# Patient Record
Sex: Male | Born: 1970 | Race: Black or African American | Hispanic: No | Marital: Married | Smoking: Light tobacco smoker
Health system: Southern US, Community
[De-identification: ages and names within clinical notes are randomized; demographics above are authoritative.]

## PROBLEM LIST (undated history)

## (undated) DIAGNOSIS — I10 Essential (primary) hypertension: Secondary | ICD-10-CM

## (undated) DIAGNOSIS — N189 Chronic kidney disease, unspecified: Secondary | ICD-10-CM

## (undated) HISTORY — PX: APPENDECTOMY: SHX54

---

## 2012-09-10 ENCOUNTER — Emergency Department: Payer: Self-pay | Admitting: Emergency Medicine

## 2013-11-30 ENCOUNTER — Other Ambulatory Visit: Payer: Self-pay | Admitting: Nephrology

## 2013-11-30 DIAGNOSIS — N289 Disorder of kidney and ureter, unspecified: Secondary | ICD-10-CM

## 2013-12-03 ENCOUNTER — Ambulatory Visit
Admission: RE | Admit: 2013-12-03 | Discharge: 2013-12-03 | Disposition: A | Discharge status: Home / Self Care | Payer: 59 | Source: Ambulatory Visit | Attending: Nephrology | Admitting: Nephrology

## 2013-12-03 DIAGNOSIS — N289 Disorder of kidney and ureter, unspecified: Secondary | ICD-10-CM

## 2013-12-03 HISTORY — DX: Chronic kidney disease, unspecified: N18.9

## 2015-09-12 IMAGING — US US RENAL
1 series · 14 of 25 positions shown · non-contrast
Comparison: None.

CLINICAL DATA: Chronic kidney disease stage III

EXAM:
RENAL/URINARY TRACT ULTRASOUND COMPLETE

[Series 1: us renal · 0.25mm/px · 14 of 32 slices shown]
[im 1/32]
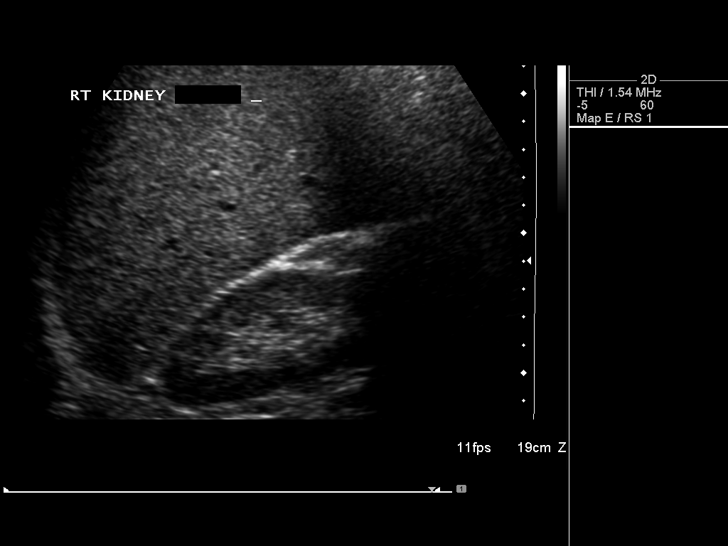
[im 3/32]
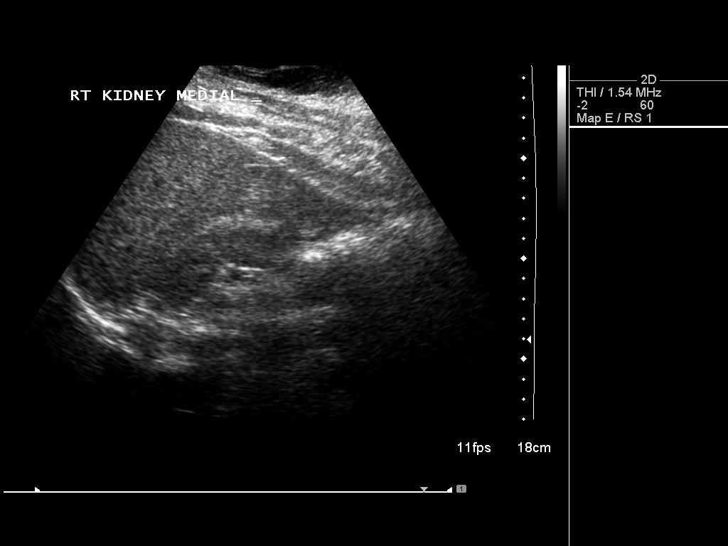
[im 6/32]
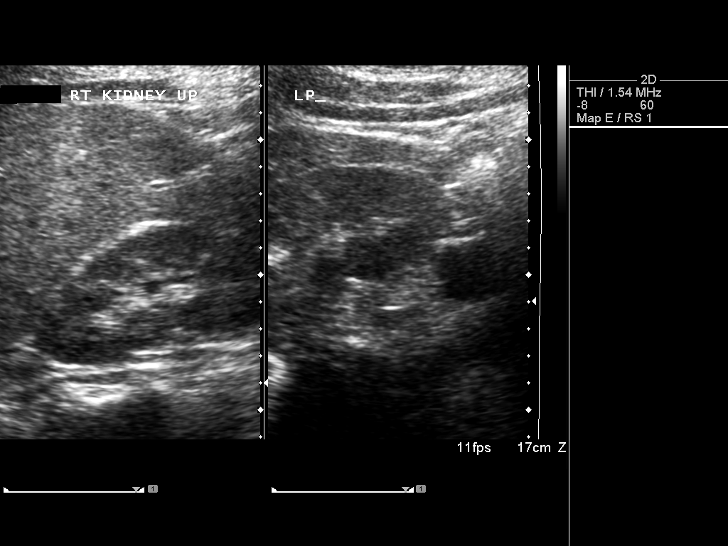
[im 8/32]
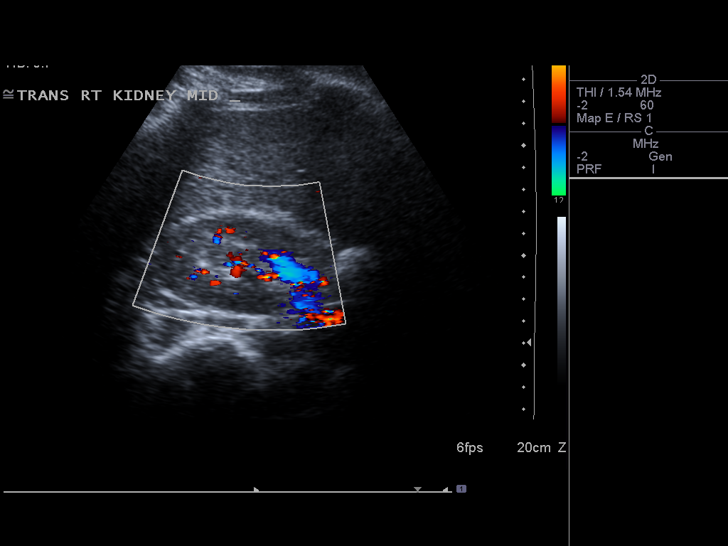
[im 11/32]
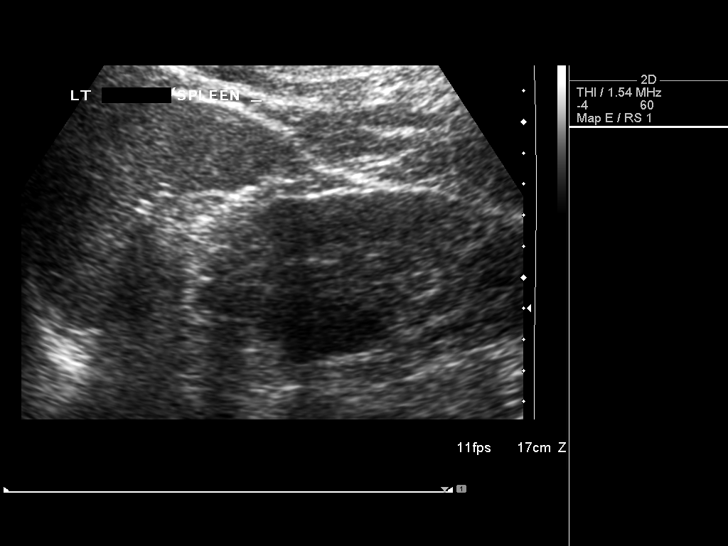
[im 12/32]
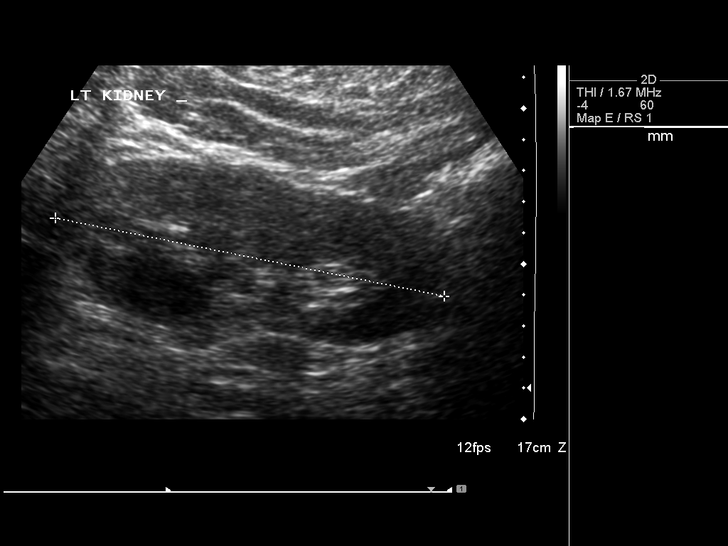
[im 15/32]
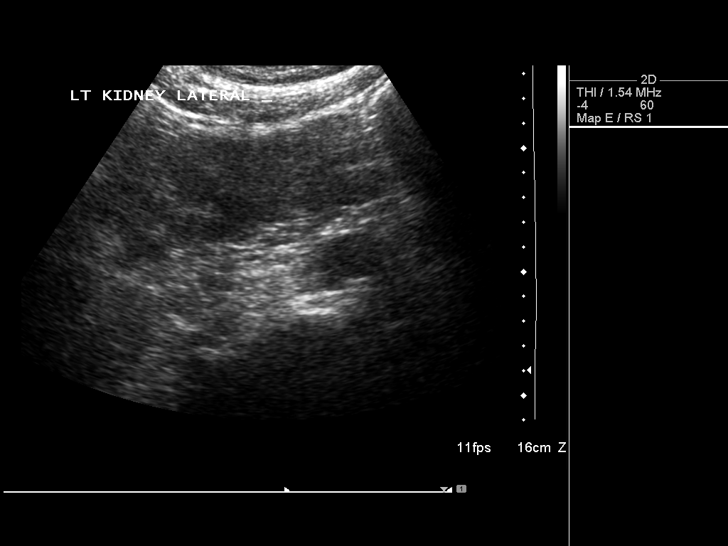
[im 17/32]
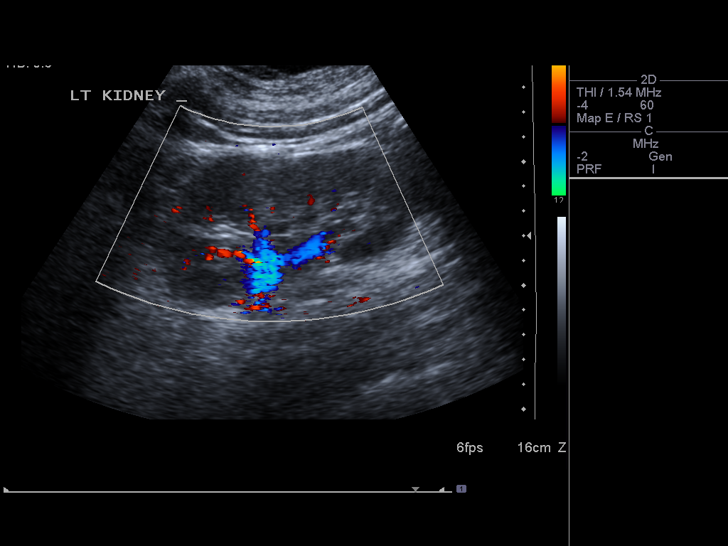
[im 20/32]
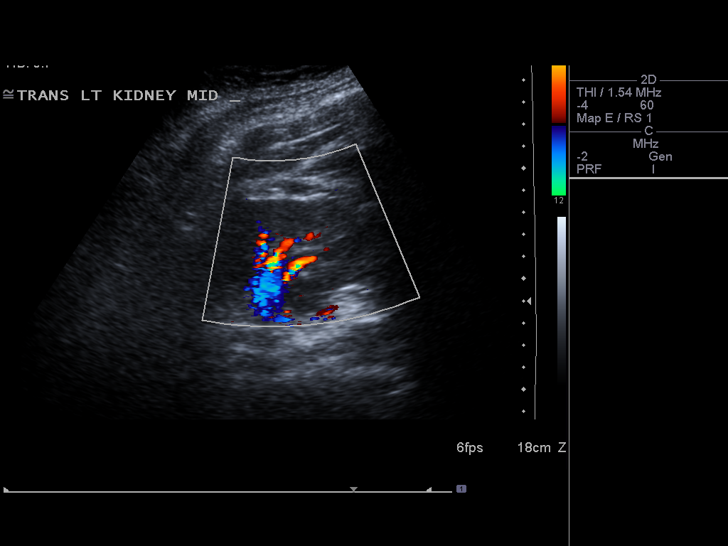
[im 21/32]
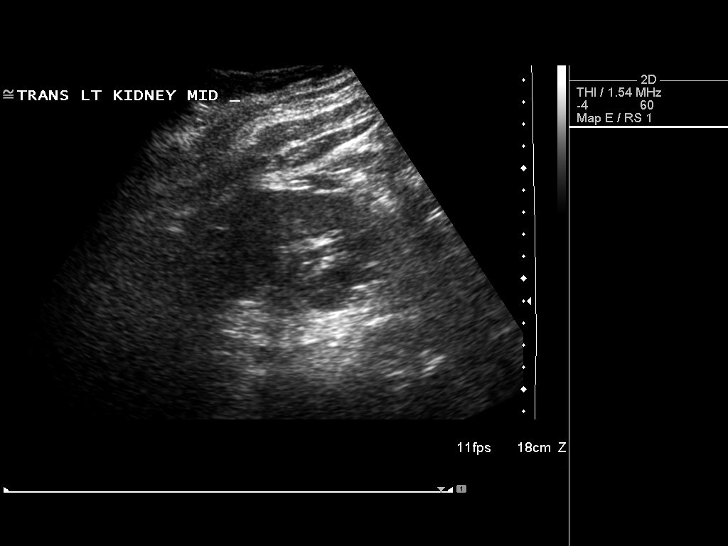
[im 24/32]
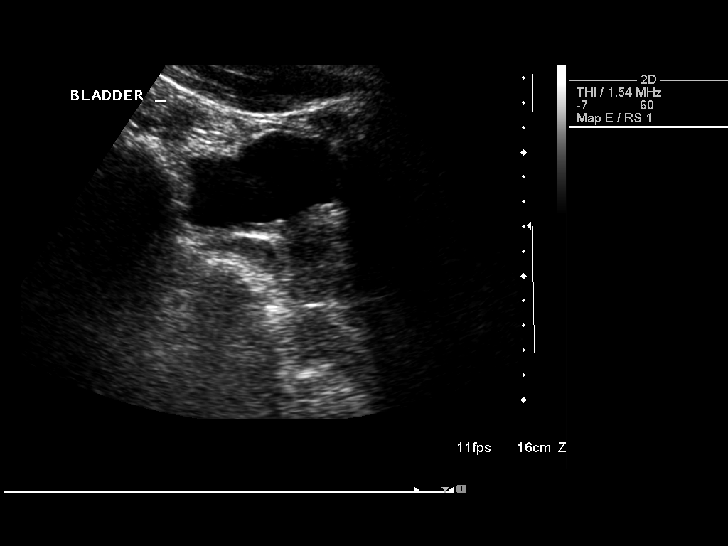
[im 26/32]
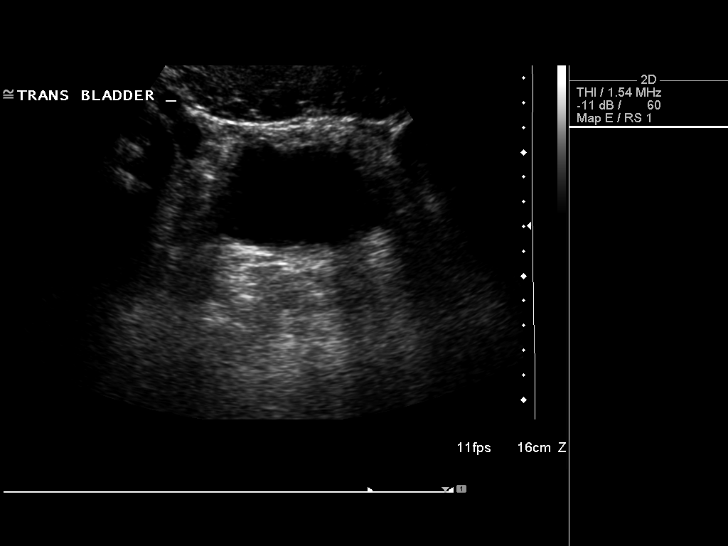
[im 29/32]
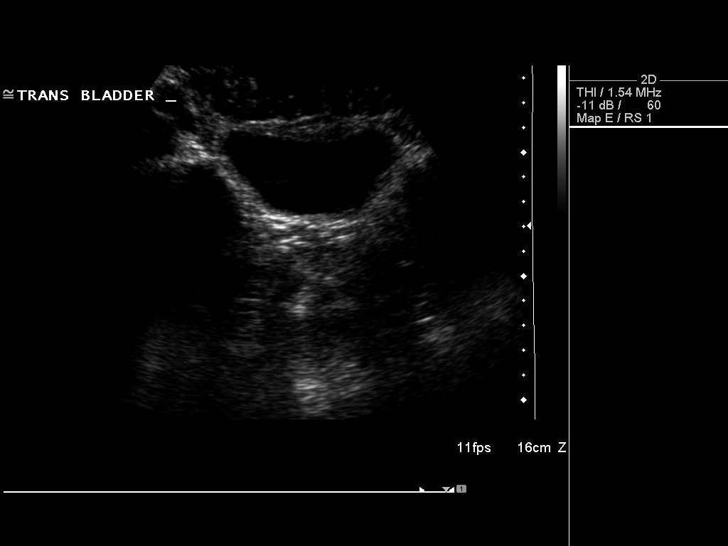
[im 32/32]
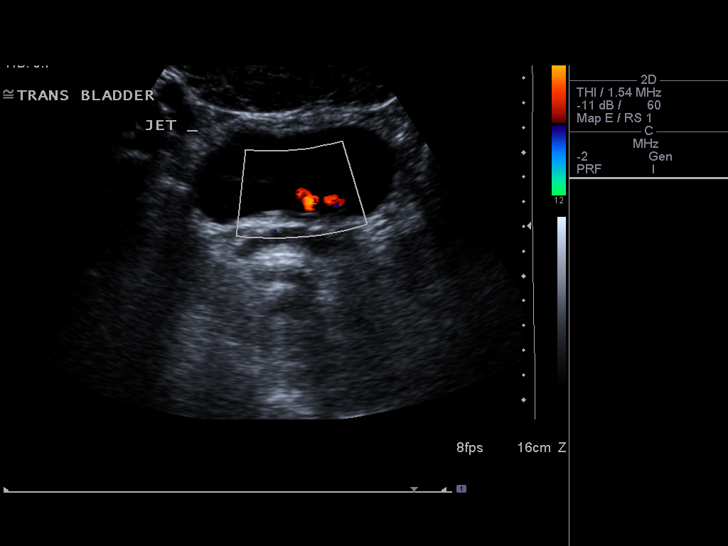

[14 of 25 positions shown; findings below may reference images not displayed]

FINDINGS: Right Kidney:

Length: 12.7 cm.  No mass or hydronephrosis.

Left Kidney:

Length: 12.8 cm.  No mass or hydronephrosis.

Bladder:

Underdistended but otherwise unremarkable.
IMPRESSION: Negative renal ultrasound.

## 2019-10-12 ENCOUNTER — Ambulatory Visit: Payer: Managed Care, Other (non HMO) | Attending: Internal Medicine

## 2019-10-12 DIAGNOSIS — Z23 Encounter for immunization: Secondary | ICD-10-CM | POA: Insufficient documentation

## 2019-10-15 NOTE — Progress Notes (Signed)
   Covid-19 Vaccination Clinic  Name:  Jermaine Garcia    MRN: [id number] DOB: Apr 10, 1971  10/12/2019  Jermaine Garcia was observed post Covid-19 immunization for 15 minutes without incidence. He was provided with Vaccine Information Sheet and instruction to access the V-Safe system.   Jermaine Garcia was instructed to call 911 with any severe reactions post vaccine: Marland Kitchen Difficulty breathing  . Swelling of your face and throat  . A fast heartbeat  . A bad rash all over your body  . Dizziness and weakness    Immunizations Administered    Name Date Dose VIS Date Route   Moderna COVID-19 Vaccine 10/12/2019 10:08 AM 0.5 mL 08/21/2019 Intramuscular   Manufacturer: Moderna   Lot: [id number]   NDC: 41962-229-79

## 2019-11-09 ENCOUNTER — Ambulatory Visit: Payer: Managed Care, Other (non HMO)

## 2019-11-16 ENCOUNTER — Ambulatory Visit: Payer: Managed Care, Other (non HMO) | Attending: Internal Medicine

## 2019-11-16 DIAGNOSIS — Z23 Encounter for immunization: Secondary | ICD-10-CM | POA: Insufficient documentation

## 2019-11-16 NOTE — Progress Notes (Signed)
   Covid-19 Vaccination Clinic  Name:  Jermaine Garcia    MRN: [id number] DOB: Feb 03, 1971  11/16/2019  [name] was observed post Covid-19 immunization for 15 minutes without incidence. He was provided with Vaccine Information Sheet and instruction to access the V-Safe system.   [name] was instructed to call 911 with any severe reactions post vaccine: Marland Kitchen Difficulty breathing  . Swelling of your face and throat  . A fast heartbeat  . A bad rash all over your body  . Dizziness and weakness    Immunizations Administered    Name Date Dose VIS Date Route   Moderna COVID-19 Vaccine 11/16/2019 10:08 AM 0.5 mL 08/21/2019 Intramuscular   Manufacturer: Moderna   Lot: [id number]   NDC: 48270-786-75

## 2020-09-24 ENCOUNTER — Other Ambulatory Visit: Payer: Managed Care, Other (non HMO)

## 2020-09-24 DIAGNOSIS — Z20822 Contact with and (suspected) exposure to covid-19: Secondary | ICD-10-CM

## 2020-09-24 NOTE — Progress Notes (Signed)
nov 

## 2020-09-25 LAB — NOVEL CORONAVIRUS, NAA: SARS-CoV-2, NAA: NOT DETECTED

## 2020-09-25 LAB — SARS-COV-2, NAA 2 DAY TAT

## 2022-03-17 ENCOUNTER — Telehealth: Payer: Self-pay

## 2022-03-17 DIAGNOSIS — Z1211 Encounter for screening for malignant neoplasm of colon: Secondary | ICD-10-CM

## 2022-03-17 MED ORDER — NA SULFATE-K SULFATE-MG SULF 17.5-3.13-1.6 GM/177ML PO SOLN: 1.0000 | Freq: Once | ORAL | 0 refills | Status: AC

## 2022-03-17 NOTE — Telephone Encounter (Signed)
Gastroenterology Pre-Procedure Review  Request Date: 04/01/22 Requesting Physician: Dr. Tobi Bastos  PATIENT REVIEW QUESTIONS: The patient responded to the following health history questions as indicated:    1. Are you having any GI issues? yes (bloating changed diet) 2. Do you have a personal history of Polyps? no 3. Do you have a family history of Colon Cancer or Polyps? no 4. Diabetes Mellitus? no 5. Joint replacements in the past 12 months?no 6. Major health problems in the past 3 months?no 7. Any artificial heart valves, MVP, or defibrillator?no    MEDICATIONS & ALLERGIES:    Patient reports the following regarding taking any anticoagulation/antiplatelet therapy:   Plavix, Coumadin, Eliquis, Xarelto, Lovenox, Pradaxa, Brilinta, or Effient? no Aspirin? no  Patient confirms/reports the following medications:  No current outpatient medications on file.   No current facility-administered medications for this visit.    Patient confirms/reports the following allergies:  Not on File  No orders of the defined types were placed in this encounter.   AUTHORIZATION INFORMATION Primary Insurance: 1D#: Group #:  Secondary Insurance: 1D#: Group #:  SCHEDULE INFORMATION: Date: 04/01/22 Time: Location: [facility]

## 2022-04-01 ENCOUNTER — Other Ambulatory Visit: Payer: Self-pay

## 2022-04-01 DIAGNOSIS — Z1211 Encounter for screening for malignant neoplasm of colon: Secondary | ICD-10-CM

## 2022-06-17 ENCOUNTER — Encounter: Payer: Self-pay | Admitting: Gastroenterology

## 2022-07-20 ENCOUNTER — Encounter
Admission: RE | Disposition: A | Discharge status: Home / Self Care | Payer: Self-pay | Source: Home / Self Care | Attending: Gastroenterology

## 2022-07-20 ENCOUNTER — Ambulatory Visit
Admission: RE | Admit: 2022-07-20 | Discharge: 2022-07-20 | Disposition: A | Discharge status: Home / Self Care | Payer: No Typology Code available for payment source | Attending: Gastroenterology | Admitting: Gastroenterology

## 2022-07-20 ENCOUNTER — Ambulatory Visit: Payer: No Typology Code available for payment source

## 2022-07-20 ENCOUNTER — Encounter: Payer: Self-pay | Admitting: Gastroenterology

## 2022-07-20 DIAGNOSIS — Z1211 Encounter for screening for malignant neoplasm of colon: Secondary | ICD-10-CM | POA: Diagnosis present

## 2022-07-20 DIAGNOSIS — G4730 Sleep apnea, unspecified: Secondary | ICD-10-CM | POA: Diagnosis not present

## 2022-07-20 DIAGNOSIS — F17290 Nicotine dependence, other tobacco product, uncomplicated: Secondary | ICD-10-CM | POA: Diagnosis not present

## 2022-07-20 DIAGNOSIS — N189 Chronic kidney disease, unspecified: Secondary | ICD-10-CM | POA: Diagnosis not present

## 2022-07-20 DIAGNOSIS — Z9049 Acquired absence of other specified parts of digestive tract: Secondary | ICD-10-CM | POA: Insufficient documentation

## 2022-07-20 HISTORY — PX: COLONOSCOPY WITH PROPOFOL: SHX5780

## 2022-07-20 SURGERY — COLONOSCOPY WITH PROPOFOL
Anesthesia: General

## 2022-07-20 MED ORDER — DEXMEDETOMIDINE HCL IN NACL 80 MCG/20ML IV SOLN
INTRAVENOUS | Status: AC
  Filled 2022-07-20: qty 20

## 2022-07-20 MED ORDER — PROPOFOL 1000 MG/100ML IV EMUL
INTRAVENOUS | Status: AC
  Filled 2022-07-20: qty 100

## 2022-07-20 MED ORDER — PROPOFOL 500 MG/50ML IV EMUL
INTRAVENOUS | Status: DC | PRN
  Administered 2022-07-20: 140 ug/kg/min via INTRAVENOUS

## 2022-07-20 MED ORDER — SODIUM CHLORIDE 0.9 % IV SOLN: INTRAVENOUS | Status: DC

## 2022-07-20 MED ORDER — PROPOFOL 10 MG/ML IV BOLUS
INTRAVENOUS | Status: AC
  Filled 2022-07-20: qty 20

## 2022-07-20 MED ORDER — PROPOFOL 10 MG/ML IV BOLUS
INTRAVENOUS | Status: DC | PRN
  Administered 2022-07-20: 80 mg via INTRAVENOUS

## 2022-07-20 MED ORDER — LIDOCAINE HCL (PF) 2 % IJ SOLN
INTRAMUSCULAR | Status: AC
  Filled 2022-07-20: qty 5

## 2022-07-20 NOTE — Anesthesia Preprocedure Evaluation (Signed)
Anesthesia Evaluation  Patient identified by MRN, date of birth, ID band Patient awake    Reviewed: Allergy & Precautions, NPO status , Patient's Chart, lab work & pertinent test results  Airway Mallampati: III  TM Distance: >3 FB Neck ROM: full    Dental  (+) Chipped   Pulmonary sleep apnea and Continuous Positive Airway Pressure Ventilation , Current Smoker and Patient abstained from smoking.,    Pulmonary exam normal        Cardiovascular negative cardio ROS Normal cardiovascular exam     Neuro/Psych negative neurological ROS  negative psych ROS   GI/Hepatic negative GI ROS, Neg liver ROS,   Endo/Other  negative endocrine ROS  Renal/GU Renal disease  negative genitourinary   Musculoskeletal   Abdominal   Peds  Hematology negative hematology ROS (+)   Anesthesia Other Findings Past Medical History: No date: Chronic kidney disease (CKD)  Past Surgical History: No date: APPENDECTOMY     Comment:  2008  BMI    Body Mass Index: 39.58 kg/m      Reproductive/Obstetrics negative OB ROS                             Anesthesia Physical Anesthesia Plan  ASA: 2  Anesthesia Plan: General   Post-op Pain Management:    Induction: Intravenous  PONV Risk Score and Plan: Propofol infusion and TIVA  Airway Management Planned: Natural Airway and Nasal Cannula  Additional Equipment:   Intra-op Plan:   Post-operative Plan:   Informed Consent: I have reviewed the patients History and Physical, chart, labs and discussed the procedure including the risks, benefits and alternatives for the proposed anesthesia with the patient or authorized representative who has indicated his/her understanding and acceptance.     Dental Advisory Given  Plan Discussed with: Anesthesiologist, CRNA and Surgeon  Anesthesia Plan Comments: (Patient consented for risks of anesthesia including but not limited  to:  - adverse reactions to medications - risk of airway placement if required - damage to eyes, teeth, lips or other oral mucosa - nerve damage due to positioning  - sore throat or hoarseness - Damage to heart, brain, nerves, lungs, other parts of body or loss of life  Patient voiced understanding.)        Anesthesia Quick Evaluation

## 2022-07-20 NOTE — Anesthesia Postprocedure Evaluation (Signed)
Anesthesia Post Note  Patient: Jermaine Garcia  Procedure(s) Performed: COLONOSCOPY WITH PROPOFOL  Patient location during evaluation: Endoscopy Anesthesia Type: General Level of consciousness: awake and alert Pain management: pain level controlled Vital Signs Assessment: post-procedure vital signs reviewed and stable Respiratory status: spontaneous breathing, nonlabored ventilation and respiratory function stable Cardiovascular status: blood pressure returned to baseline and stable Postop Assessment: no apparent nausea or vomiting Anesthetic complications: no   No notable events documented.   Last Vitals:  Vitals:   07/20/22 0928 07/20/22 0947  BP: 114/81 109/80  Pulse: (!) 58 (!) 58  Resp: 14 14  Temp:    SpO2: 96% 96%    Last Pain:  Vitals:   07/20/22 0928  TempSrc:   PainSc: 0-No pain                 Alphonsus Sias

## 2022-07-20 NOTE — Op Note (Signed)
[facility] Gastroenterology Patient Name: Jermaine Garcia Procedure Date: 07/20/2022 9:16 AM MRN: [id number] Account #: [id number] Date of Birth: [birth year] Admit Type: Outpatient Age: 51 Room: [facility] ENDO ROOM 2 Gender: Male Note Status: Finalized Instrument Name: Jasper Riling [id number] Procedure:             Colonoscopy Indications:           Screening for colorectal malignant neoplasm Providers:             Jonathon Bellows MD, MD Referring MD:          No Local Md, MD (Referring MD) Medicines:             Monitored Anesthesia Care Complications:         No immediate complications. Procedure:             Pre-Anesthesia Assessment:                        - Prior to the procedure, a History and Physical was                         performed, and patient medications, allergies and                         sensitivities were reviewed. The patient's tolerance                         of previous anesthesia was reviewed.                        - The risks and benefits of the procedure and the                         sedation options and risks were discussed with the                         patient. All questions were answered and informed                         consent was obtained.                        - ASA Grade Assessment: II - A patient with mild                         systemic disease.                        After obtaining informed consent, the colonoscope was                         passed under direct vision. Throughout the procedure,                         the patient's blood pressure, pulse, and oxygen                         saturations were monitored continuously. The                         Colonoscope was  introduced through the anus with the                         intention of advancing to the cecum. The scope was                         advanced to the sigmoid colon before the procedure was                         aborted. Medications were given. The  colonoscopy was                         performed with ease. The patient tolerated the                         procedure well. The quality of the bowel preparation                         was inadequate. Findings:      The perianal and digital rectal examinations were normal.      A large amount of semi-liquid stool was found in the rectum and in the       sigmoid colon, interfering with visualization. Impression:            - Preparation of the colon was inadequate.                        - Stool in the rectum and in the sigmoid colon.                        - No specimens collected. Recommendation:        - Discharge patient to home (with escort).                        - Resume previous diet.                        - Continue present medications.                        - Repeat colonoscopy in 2 weeks because the bowel                         preparation was suboptimal. Procedure Code(s):     --- Professional ---                        [phone], 53, Colonoscopy, flexible; diagnostic,                         including collection of specimen(s) by brushing or                         washing, when performed (separate procedure) Diagnosis Code(s):     --- Professional ---                        Z12.11, Encounter for screening for malignant neoplasm  of colon CPT copyright 2022 American Medical Association. All rights reserved. The codes documented in this report are preliminary and upon coder review may  be revised to meet current compliance requirements. Jonathon Bellows, MD Jonathon Bellows MD, MD 07/20/2022 9:24:20 AM This report has been signed electronically. Number of Addenda: 0 Note Initiated On: 07/20/2022 9:16 AM Total Procedure Duration: 0 hours 0 minutes 46 seconds  Estimated Blood Loss:  Estimated blood loss: none.      [facility]

## 2022-07-20 NOTE — H&P (Signed)
     Jonathon Bellows, MD [address] [place], [place], [place], [address] [address], [place], [place], [place], [address] Phone: [phone]  Fax: [phone]  Primary Care Physician:  Patient, No Pcp Per   Pre-Procedure History & Physical: HPI:  Jermaine Garcia is a 51 y.o. male is here for an colonoscopy.   Past Medical History:  Diagnosis Date   Chronic kidney disease (CKD)     Past Surgical History:  Procedure Laterality Date   APPENDECTOMY     2008    Prior to Admission medications   Not on File    Allergies as of 04/01/2022   (No Known Allergies)    History reviewed. No pertinent family history.  Social History   Socioeconomic History   Marital status: Married    Spouse name: Not on file   Number of children: Not on file   Years of education: Not on file   Highest education level: Not on file  Occupational History   Not on file  Tobacco Use   Smoking status: Light Smoker    Types: Cigars   Smokeless tobacco: Not on file  Vaping Use   Vaping Use: Never used  Substance and Sexual Activity   Alcohol use: Yes    Comment: occasionally   Drug use: Never   Sexual activity: Not on file  Other Topics Concern   Not on file  Social History Narrative   Not on file   Social Determinants of Health   Financial Resource Strain: Not on file  Food Insecurity: Not on file  Transportation Needs: Not on file  Physical Activity: Not on file  Stress: Not on file  Social Connections: Not on file  Intimate Partner Violence: Not on file    Review of Systems: See HPI, otherwise negative ROS  Physical Exam: BP 125/60   Pulse 61   Temp (!) 97.5 F (36.4 C) (Temporal)   Resp 18   Ht 6\' 1"  (1.854 m)   Wt 136.1 kg   SpO2 97%   BMI 39.58 kg/m  General:   Alert,  pleasant and cooperative in NAD Head:  Normocephalic and atraumatic. Neck:  Supple; no masses or thyromegaly. Lungs:  Clear throughout to auscultation, normal respiratory effort.    Heart:   +S1, +S2, Regular rate and rhythm, No edema. Abdomen:  Soft, nontender and nondistended. Normal bowel sounds, without guarding, and without rebound.   Neurologic:  Alert and  oriented x4;  grossly normal neurologically.  Impression/Plan: Jermaine Garcia is here for an colonoscopy to be performed for Screening colonoscopy average risk   Risks, benefits, limitations, and alternatives regarding  colonoscopy have been reviewed with the patient.  Questions have been answered.  All parties agreeable.   Jonathon Bellows, MD  07/20/2022, 9:09 AM

## 2022-07-20 NOTE — Transfer of Care (Signed)
Immediate Anesthesia Transfer of Care Note  Patient: Jermaine Garcia  Procedure(s) Performed: COLONOSCOPY WITH PROPOFOL  Patient Location: Endoscopy Unit  Anesthesia Type:General  Level of Consciousness: drowsy  Airway & Oxygen Therapy: Patient Spontanous Breathing  Post-op Assessment: Report given to RN and Post -op Vital signs reviewed and stable  Post vital signs: Reviewed and stable  Last Vitals:  Vitals Value Taken Time  BP 103/75 07/20/22 0927  Temp 36 C 07/20/22 0926  Pulse 65 07/20/22 0928  Resp 0 07/20/22 0927  SpO2 97 % 07/20/22 0928  Vitals shown include unvalidated device data.  Last Pain:  Vitals:   07/20/22 0926  TempSrc: Tympanic  PainSc: 0-No pain         Complications: No notable events documented.

## 2022-07-21 ENCOUNTER — Encounter: Payer: Self-pay | Admitting: Gastroenterology

## 2022-12-23 ENCOUNTER — Other Ambulatory Visit: Payer: Self-pay

## 2022-12-23 ENCOUNTER — Telehealth: Payer: Self-pay

## 2022-12-23 DIAGNOSIS — Z1211 Encounter for screening for malignant neoplasm of colon: Secondary | ICD-10-CM

## 2022-12-23 MED ORDER — PEG 3350-KCL-NA BICARB-NACL 420 G PO SOLR: 4000.0000 mL | Freq: Every day | ORAL | 0 refills | Status: AC

## 2022-12-23 NOTE — Telephone Encounter (Signed)
Gastroenterology Pre-Procedure Review  Request Date: 01/05/23 Requesting Physician: Dr. Vicente Males  PATIENT REVIEW QUESTIONS: The patient responded to the following health history questions as indicated:    REPEAT COLONOSCOPY DUE TO POOR PREP COLONOSCOPY WAS NOT PERFORMED. ADVISED 2 DAY PREP USING NULTYTELY.  1. Are you having any GI issues? no 2. Do you have a personal history of Polyps? no 3. Do you have a family history of Colon Cancer or Polyps? no 4. Diabetes Mellitus? no 5. Joint replacements in the past 12 months?no 6. Major health problems in the past 3 months?no 7. Any artificial heart valves, MVP, or defibrillator?no    MEDICATIONS & ALLERGIES:    Patient reports the following regarding taking any anticoagulation/antiplatelet therapy:   Plavix, Coumadin, Eliquis, Xarelto, Lovenox, Pradaxa, Brilinta, or Effient? no Aspirin? no  Patient confirms/reports the following medications:  Current Outpatient Medications  Medication Sig Dispense Refill   acetaminophen (TYLENOL) 325 MG tablet TAKE ONE TABLET BY MOUTH  PRN     benazepril (LOTENSIN) 10 MG tablet Take 10 mg by mouth daily.     Cholecalciferol (VITAMIN D3) 50 MCG (2000 UT) TABS Take by mouth.     polyethylene glycol-electrolytes (NULYTELY) 420 g solution Take 4,000 mLs by mouth daily for 2 days. 8000 mL 0   No current facility-administered medications for this visit.    Patient confirms/reports the following allergies:  Allergies  Allergen Reactions   Lisinopril Cough    No orders of the defined types were placed in this encounter.   AUTHORIZATION INFORMATION Primary Insurance: 1D#: Group #:  Secondary Insurance: 1D#: Group #:  SCHEDULE INFORMATION: Date: 01/05/23 Time: Location: [facility]

## 2023-01-04 ENCOUNTER — Encounter: Payer: Self-pay | Admitting: Gastroenterology

## 2023-01-05 ENCOUNTER — Ambulatory Visit
Admission: RE | Admit: 2023-01-05 | Discharge: 2023-01-05 | Disposition: A | Discharge status: Home / Self Care | Payer: No Typology Code available for payment source | Attending: Gastroenterology | Admitting: Gastroenterology

## 2023-01-05 ENCOUNTER — Encounter: Payer: Self-pay | Admitting: Gastroenterology

## 2023-01-05 ENCOUNTER — Ambulatory Visit: Payer: No Typology Code available for payment source | Admitting: Certified Registered"

## 2023-01-05 ENCOUNTER — Encounter
Admission: RE | Disposition: A | Discharge status: Home / Self Care | Payer: Self-pay | Source: Home / Self Care | Attending: Gastroenterology

## 2023-01-05 ENCOUNTER — Other Ambulatory Visit: Payer: Self-pay

## 2023-01-05 DIAGNOSIS — F17290 Nicotine dependence, other tobacco product, uncomplicated: Secondary | ICD-10-CM | POA: Insufficient documentation

## 2023-01-05 DIAGNOSIS — I129 Hypertensive chronic kidney disease with stage 1 through stage 4 chronic kidney disease, or unspecified chronic kidney disease: Secondary | ICD-10-CM | POA: Insufficient documentation

## 2023-01-05 DIAGNOSIS — Z6841 Body Mass Index (BMI) 40.0 and over, adult: Secondary | ICD-10-CM | POA: Diagnosis not present

## 2023-01-05 DIAGNOSIS — Z1211 Encounter for screening for malignant neoplasm of colon: Secondary | ICD-10-CM | POA: Diagnosis not present

## 2023-01-05 DIAGNOSIS — D122 Benign neoplasm of ascending colon: Secondary | ICD-10-CM | POA: Insufficient documentation

## 2023-01-05 DIAGNOSIS — N189 Chronic kidney disease, unspecified: Secondary | ICD-10-CM | POA: Diagnosis not present

## 2023-01-05 HISTORY — PX: COLONOSCOPY WITH PROPOFOL: SHX5780

## 2023-01-05 HISTORY — DX: Essential (primary) hypertension: I10

## 2023-01-05 SURGERY — COLONOSCOPY WITH PROPOFOL
Anesthesia: General

## 2023-01-05 MED ORDER — LIDOCAINE HCL (PF) 2 % IJ SOLN
INTRAMUSCULAR | Status: DC | PRN
  Administered 2023-01-05: 40 mg via INTRADERMAL

## 2023-01-05 MED ORDER — PROPOFOL 10 MG/ML IV BOLUS
INTRAVENOUS | Status: AC
  Filled 2023-01-05: qty 40

## 2023-01-05 MED ORDER — PROPOFOL 10 MG/ML IV BOLUS
INTRAVENOUS | Status: DC | PRN
  Administered 2023-01-05: 30 mg via INTRAVENOUS
  Administered 2023-01-05: 70 mg via INTRAVENOUS
  Administered 2023-01-05 (×10): 30 mg via INTRAVENOUS

## 2023-01-05 MED ORDER — SODIUM CHLORIDE 0.9 % IV SOLN: INTRAVENOUS | Status: DC

## 2023-01-05 NOTE — H&P (Signed)
     Wyline Mood, MD [address], [address], [place], [place], [address] [address], [place], [place], [address] Phone: [phone]  Fax: [phone]  Primary Care Physician:  Patient, No Pcp Per   Pre-Procedure History & Physical: HPI:  Jermaine Garcia is a 52 y.o. male is here for an colonoscopy.   Past Medical History:  Diagnosis Date   Chronic kidney disease (CKD)    Hypertension     Past Surgical History:  Procedure Laterality Date   APPENDECTOMY     2008   COLONOSCOPY WITH PROPOFOL N/A 07/20/2022   Procedure: COLONOSCOPY WITH PROPOFOL;  Surgeon: Wyline Mood, MD;  Location: [facility] ENDOSCOPY;  Service: Gastroenterology;  Laterality: N/A;    Prior to Admission medications   Medication Sig Start Date End Date Taking? Authorizing Provider  acetaminophen (TYLENOL) 325 MG tablet TAKE ONE TABLET BY MOUTH  PRN 10/09/10  Yes [provider]  benazepril (LOTENSIN) 10 MG tablet Take 10 mg by mouth daily.   Yes [provider]  Cholecalciferol (VITAMIN D3) 50 MCG (2000 UT) TABS Take by mouth.   Yes [provider]    Allergies as of 12/23/2022 - Review Complete 07/20/2022  Allergen Reaction Noted   Lisinopril Cough 12/14/2010    History reviewed. No pertinent family history.  Social History   Socioeconomic History   Marital status: Married    Spouse name: Not on file   Number of children: Not on file   Years of education: Not on file   Highest education level: Not on file  Occupational History   Not on file  Tobacco Use   Smoking status: Light Smoker    Types: Cigars   Smokeless tobacco: Not on file  Vaping Use   Vaping Use: Never used  Substance and Sexual Activity   Alcohol use: Yes    Comment: occasionally   Drug use: Never   Sexual activity: Not on file  Other Topics Concern   Not on file  Social History Narrative   Not on file   Social Determinants of Health   Financial Resource Strain: Not on file  Food  Insecurity: Not on file  Transportation Needs: Not on file  Physical Activity: Not on file  Stress: Not on file  Social Connections: Not on file  Intimate Partner Violence: Not on file    Review of Systems: See HPI, otherwise negative ROS  Physical Exam: BP (!) 156/103   Pulse 64   Temp (!) 97.4 F (36.3 C) (Temporal)   Resp 16   Ht  (1.854 m)   Wt (!) 156.5 kg   SpO2 99%   BMI 45.52 kg/m  General:   Alert,  pleasant and cooperative in NAD Head:  Normocephalic and atraumatic. Neck:  Supple; no masses or thyromegaly. Lungs:  Clear throughout to auscultation, normal respiratory effort.    Heart:  +S1, +S2, Regular rate and rhythm, No edema. Abdomen:  Soft, nontender and nondistended. Normal bowel sounds, without guarding, and without rebound.   Neurologic:  Alert and  oriented x4;  grossly normal neurologically.  Impression/Plan: Jermaine Garcia is here for an colonoscopy to be performed for Screening colonoscopy average risk   Risks, benefits, limitations, and alternatives regarding  colonoscopy have been reviewed with the patient.  Questions have been answered.  All parties agreeable.   Wyline Mood, MD  01/05/2023, 12:07 PM

## 2023-01-05 NOTE — Op Note (Signed)
[facility] Gastroenterology Patient Name: Jermaine Garcia Procedure Date: 01/05/2023 11:15 AM MRN: [id number] Account #: [id number] Date of Birth: 25-Feb-1971 Admit Type: Outpatient Age: 52 Room: [facility] ENDO ROOM 3 Gender: Male Note Status: Finalized Instrument Name: Prentice Docker [id number] Procedure:             Colonoscopy Indications:           Screening for colorectal malignant neoplasm,                         inadequate bowel prep on last colonoscopy (more recent                         than 10 years ago), Last colonoscopy: October 2023 Providers:             Wyline Mood MD, MD Medicines:             Monitored Anesthesia Care Complications:         No immediate complications. Procedure:             Pre-Anesthesia Assessment:                        - Prior to the procedure, a History and Physical was                         performed, and patient medications, allergies and                         sensitivities were reviewed. The patient's tolerance                         of previous anesthesia was reviewed.                        - The risks and benefits of the procedure and the                         sedation options and risks were discussed with the                         patient. All questions were answered and informed                         consent was obtained.                        - ASA Grade Assessment: II - A patient with mild                         systemic disease.                        After obtaining informed consent, the colonoscope was                         passed under direct vision. Throughout the procedure,                         the patient's blood pressure, pulse, and oxygen  saturations were monitored continuously. The                         Colonoscope was introduced through the anus and                         advanced to the the cecum, identified by the                         appendiceal orifice. The  colonoscopy was performed                         with ease. The patient tolerated the procedure well.                         The quality of the bowel preparation was good. The                         ileocecal valve, appendiceal orifice, and rectum were                         photographed. Findings:      Four sessile polyps were found in the ascending colon. The polyps were 4       to 5 mm in size. These polyps were removed with a cold snare. Resection       and retrieval were complete.      The exam was otherwise without abnormality on direct and retroflexion       views. Impression:            - Four 4 to 5 mm polyps in the ascending colon,                         removed with a cold snare. Resected and retrieved.                        - The examination was otherwise normal on direct and                         retroflexion views. Recommendation:        - Discharge patient to home (with escort).                        - Resume previous diet.                        - Continue present medications.                        - Await pathology results.                        - Repeat colonoscopy for surveillance based on                         pathology results. Procedure Code(s):     --- Professional ---                        [phone], Colonoscopy, flexible; with removal of  tumor(s), polyp(s), or other lesion(s) by snare                         technique Diagnosis Code(s):     --- Professional ---                        Z12.11, Encounter for screening for malignant neoplasm                         of colon                        D12.2, Benign neoplasm of ascending colon CPT copyright 2022 American Medical Association. All rights reserved. The codes documented in this report are preliminary and upon coder review may  be revised to meet current compliance requirements. Wyline Mood, MD Wyline Mood MD, MD 01/05/2023 12:36:11 PM This report has been signed  electronically. Number of Addenda: 0 Note Initiated On: 01/05/2023 11:15 AM Scope Withdrawal Time: 0 hours 12 minutes 21 seconds  Total Procedure Duration: 0 hours 21 minutes 28 seconds  Estimated Blood Loss:  Estimated blood loss: none.      [facility]

## 2023-01-05 NOTE — Transfer of Care (Signed)
Immediate Anesthesia Transfer of Care Note  Patient: Jermaine Garcia  Procedure(s) Performed: COLONOSCOPY WITH PROPOFOL  Patient Location: PACU and Endoscopy Unit  Anesthesia Type:MAC  Level of Consciousness: drowsy  Airway & Oxygen Therapy: Patient Spontanous Breathing and Patient connected to nasal cannula oxygen  Post-op Assessment: Report given to RN and Post -op Vital signs reviewed and stable  Post vital signs: Reviewed and stable  Last Vitals:  Vitals Value Taken Time  BP 137/76 01/05/23 1238  Temp    Pulse 70 01/05/23 1238  Resp 21 01/05/23 1238  SpO2 95 % 01/05/23 1238  Vitals shown include unvalidated device data.  Last Pain:  Vitals:   01/05/23 1147  TempSrc: Temporal  PainSc: 0-No pain         Complications: No notable events documented.

## 2023-01-05 NOTE — Anesthesia Preprocedure Evaluation (Signed)
Anesthesia Evaluation  Patient identified by MRN, date of birth, ID band Patient awake    Reviewed: Allergy & Precautions, NPO status , Patient's Chart, lab work & pertinent test results  Airway Mallampati: III  TM Distance: >3 FB Neck ROM: Full    Dental  (+) Chipped,    Pulmonary neg pulmonary ROS, Current SmokerPatient did not abstain from smoking.   Pulmonary exam normal  + decreased breath sounds      Cardiovascular Exercise Tolerance: Good hypertension, Pt. on medications negative cardio ROS Normal cardiovascular exam Rhythm:Regular Rate:Normal     Neuro/Psych negative neurological ROS  negative psych ROS   GI/Hepatic negative GI ROS, Neg liver ROS,,,  Endo/Other  negative endocrine ROS  Morbid obesity  Renal/GU CRFRenal diseasenegative Renal ROS  negative genitourinary   Musculoskeletal   Abdominal  (+) + obese  Peds negative pediatric ROS (+)  Hematology negative hematology ROS (+)   Anesthesia Other Findings Past Medical History: No date: Chronic kidney disease (CKD) No date: Hypertension  Past Surgical History: No date: APPENDECTOMY     Comment:  2008 07/20/2022: COLONOSCOPY WITH PROPOFOL; N/A     Comment:  Procedure: COLONOSCOPY WITH PROPOFOL;  Surgeon: Wyline Mood, MD;  Location: [facility] ENDOSCOPY;  Service:               Gastroenterology;  Laterality: N/A;  BMI    Body Mass Index: 45.52 kg/m      Reproductive/Obstetrics negative OB ROS                             Anesthesia Physical Anesthesia Plan  ASA: 3  Anesthesia Plan: General   Post-op Pain Management:    Induction: Intravenous  PONV Risk Score and Plan: Propofol infusion and TIVA  Airway Management Planned: Natural Airway  Additional Equipment:   Intra-op Plan:   Post-operative Plan:   Informed Consent: I have reviewed the patients History and Physical, chart, labs and discussed  the procedure including the risks, benefits and alternatives for the proposed anesthesia with the patient or authorized representative who has indicated his/her understanding and acceptance.     Dental Advisory Given  Plan Discussed with: CRNA and Surgeon  Anesthesia Plan Comments:        Anesthesia Quick Evaluation

## 2023-01-05 NOTE — Anesthesia Postprocedure Evaluation (Signed)
Anesthesia Post Note  Patient: Jermaine Garcia  Procedure(s) Performed: COLONOSCOPY WITH PROPOFOL  Patient location during evaluation: PACU Anesthesia Type: General Level of consciousness: awake and awake and alert Pain management: satisfactory to patient Vital Signs Assessment: post-procedure vital signs reviewed and stable Respiratory status: spontaneous breathing and respiratory function stable Cardiovascular status: stable Anesthetic complications: no   No notable events documented.   Last Vitals:  Vitals:   01/05/23 1247 01/05/23 1257  BP:  (!) 148/100  Pulse:    Resp:    Temp:    SpO2: 100%     Last Pain:  Vitals:   01/05/23 1257  TempSrc:   PainSc: 0-No pain                 VAN STAVEREN,Corde Antonini

## 2023-01-06 ENCOUNTER — Encounter: Payer: Self-pay | Admitting: Gastroenterology

## 2023-01-06 LAB — SURGICAL PATHOLOGY

## 2024-10-02 ENCOUNTER — Other Ambulatory Visit: Payer: Self-pay

## 2024-10-02 DIAGNOSIS — R061 Stridor: Secondary | ICD-10-CM

## 2024-10-05 ENCOUNTER — Ambulatory Visit
Admission: RE | Admit: 2024-10-05 | Discharge: 2024-10-05 | Disposition: A | Discharge status: Home / Self Care | Source: Ambulatory Visit

## 2024-10-05 DIAGNOSIS — R061 Stridor: Secondary | ICD-10-CM | POA: Insufficient documentation

## 2024-10-12 ENCOUNTER — Ambulatory Visit: Admitting: Pulmonary Disease

## 2024-10-25 ENCOUNTER — Ambulatory Visit: Admitting: Sleep Medicine

## 2024-10-25 ENCOUNTER — Encounter: Payer: Self-pay | Admitting: Sleep Medicine

## 2024-10-25 VITALS — BP 122/82 | HR 72 | Temp 98.5°F | Ht 73.0 in | Wt 345.6 lb

## 2024-10-25 DIAGNOSIS — G4752 REM sleep behavior disorder: Secondary | ICD-10-CM

## 2024-10-25 DIAGNOSIS — F5104 Psychophysiologic insomnia: Secondary | ICD-10-CM

## 2024-10-25 DIAGNOSIS — G4733 Obstructive sleep apnea (adult) (pediatric): Secondary | ICD-10-CM

## 2024-10-25 DIAGNOSIS — I10 Essential (primary) hypertension: Secondary | ICD-10-CM

## 2024-10-25 NOTE — Patient Instructions (Addendum)
" °  Patient Instructions Need to be using your CPAP every night, minimum of 7-8 hours a night.  Change equipment as directed. Wash your tubing with warm soap and water  weekly, hang to dry. Wash humidifier portion weekly. Use bottled, distilled water  and change daily Be aware of reduced alertness and do not drive or operate heavy machinery if experiencing this or drowsiness.  Exercise encouraged, as tolerated. Healthy weight management discussed.  Avoid or decrease alcohol consumption and medications that make you more sleepy, if possible. Notify if persistent daytime sleepiness occurs even with consistent use of PAP therapy.   Change CPAP supplies... Every month Mask cushions and/or nasal pillows CPAP machine filters Every 3 months Mask frame (not including the headgear) CPAP tubing Every 6 months Mask headgear Chin strap (if applicable) Humidifier water  tub   Be aware of reduced alertness and do not drive or operate heavy machinery if experiencing this or drowsiness.  Exercise encouraged, as tolerated. Encouraged proper weight management.  Important to get eight or more hours of sleep  Limiting the use of the computer and television before bedtime.  Decrease naps during the day, so night time sleep will become enhanced.  Limit caffeine, and sleep deprivation.  HTN, stroke, uncontrolled diabetes and heart failure are potential risk factors.  Risk of untreated sleep apnea including cardiac arrhthymias, stroke, DM, pulm HTN.           "

## 2024-10-25 NOTE — Progress Notes (Signed)
 "      Name:Jermaine Garcia MRN: [id number] DOB: 1971/06/18   CHIEF COMPLAINT:  ESTABLISH CARE FOR OSA   HISTORY OF PRESENT ILLNESS: Jermaine Garcia is a 54 y.o. w/ a h/o OSA, HTN, PTSD, anxiety, depression, prediabetes and morbid obesity who presents to establish care for OSA. Reports that he was initially diagnosed with OSA two years ago and was subsequently started on CPAP therapy. Reports difficulty using CPAP therapy due to mask and pressure discomfort. He is currently using the Airfit P10 nasal pillow mask. Reports dream enactment twice per week. Reports loud snoring and witnessed apnea without CPAP therapy.   Reports nocturnal awakenings due to nocturia or nightmares and has difficulty falling back to sleep. Reports significant weight changes. Admits to dry mouth, night sweats and morning headaches. Denies RLS symptoms, dream enactment, cataplexy, hypnagogic or hypnapompic hallucinations. Denies a family history of sleep apnea. Reports occasional drowsy driving. Drinks 2 cups of coffee and 1 energy drinks daily, occasional alcohol use, uses smokeless tobacco, denies illicit drug use.   Bedtime 10 pm Sleep onset 2 hours Rise time 7-8 or 9-10 am   EPWORTH SLEEP SCORE 17    10/25/2024    9:17 AM  Results of the Epworth flowsheet  Sitting and reading 3  Watching TV 2  Sitting, inactive in a public place (e.g. a theatre or a meeting) 2  As a passenger in a car for an hour without a break 1  Lying down to rest in the afternoon when circumstances permit 3  Sitting and talking to someone 2  Sitting quietly after a lunch without alcohol 3  In a car, while stopped for a few minutes in traffic 1  Total score 17    PAST MEDICAL HISTORY :   has a past medical history of Chronic kidney disease (CKD) and Hypertension.  has a past surgical history that includes Appendectomy; Colonoscopy with propofol  (N/A, 07/20/2022); and Colonoscopy with propofol  (N/A, 01/05/2023). Prior to Admission  medications  Medication Sig Start Date End Date Taking? Authorizing Provider  acetaminophen (TYLENOL) 325 MG tablet TAKE ONE TABLET BY MOUTH  PRN 10/09/10   [provider]  benazepril (LOTENSIN) 10 MG tablet Take 10 mg by mouth daily.    [provider]  Cholecalciferol (VITAMIN D3) 50 MCG (2000 UT) TABS Take by mouth.    [provider]   Allergies[1]  FAMILY HISTORY:  family history is not on file. SOCIAL HISTORY:  reports that he has been smoking cigars. He does not have any smokeless tobacco history on file. He reports current alcohol use. He reports that he does not use drugs.   Review of Systems:  Gen:  Denies  fever, sweats, chills weight loss  HEENT: Denies blurred vision, double vision, ear pain, eye pain, hearing loss, nose bleeds, sore throat Cardiac:  No dizziness, chest pain or heaviness, chest tightness,edema, No JVD Resp:   No cough, -sputum production, -shortness of breath,-wheezing, -hemoptysis,  Gi: Denies swallowing difficulty, stomach pain, nausea or vomiting, diarrhea, constipation, bowel incontinence Gu:  Denies bladder incontinence, burning urine Ext:   Denies Joint pain, stiffness or swelling Skin: Denies  skin rash, easy bruising or bleeding or hives Endoc:  Denies polyuria, polydipsia , polyphagia or weight change Psych:   Denies depression, insomnia or hallucinations  Other:  All other systems negative  VITAL SIGNS: BP 122/82   Pulse 72   Temp 98.5 F (36.9 C)   Ht 6' 1 (1.854 m)   [name])  345 lb 9.6 oz (156.8 kg)   SpO2 96%   BMI 45.60 kg/m    Physical Examination:   General Appearance: No distress  EYES PERRLA, EOM intact.   NECK Supple, No JVD Pulmonary: normal breath sounds, No wheezing.  CardiovascularNormal S1,S2.  No m/r/g.   Abdomen: Benign, Soft, non-tender. Skin:   warm, no rashes, no ecchymosis  Extremities: normal, no cyanosis, clubbing. Neuro:without focal findings,  speech normal  PSYCHIATRIC: Mood,  affect within normal limits.   ASSESSMENT AND PLAN  OSA I suspect that OSA is likely worsened secondary to weight changes. Will complete in lab CPAP titration study. To improve comfort, will try patient on the Airtouch N30i nasal mask and will also narrow pressure range to 5-15 cm H2O. Discussed the consequences of untreated sleep apnea. Advised not to drive drowsy for safety of patient and others. Will follow up to review CPAP titration study results.    RBD Will complete further evaluation with in lab PSG with RBD montage. Counseled patient on safety precautions.   HTN Stable, on current management. Following with PCP.   Insomnia Counseled patient on stimulus control and improving sleep hygiene practices.    MEDICATION ADJUSTMENTS/LABS AND TESTS ORDERED: Recommend Sleep Study   Patient  satisfied with Plan of action and management. All questions answered  Follow up to review sleep study results and treatment plan.   I spent a total of 63 minutes reviewing chart data, face-to-face evaluation with the patient, counseling and coordination of care as detailed above.    Keilen Kahl, M.D.  Sleep Medicine [facility]           [1]  Allergies Allergen Reactions   Lisinopril Cough   "

## 2024-10-26 NOTE — Telephone Encounter (Signed)
 Lm x1 for Jermaine Garcia at the [facility] to get the patient's sleep study results.  [phone] ext [phone]

## 2025-01-22 ENCOUNTER — Ambulatory Visit: Admitting: Sleep Medicine
